# Patient Record
Sex: Female | Born: 1950 | Race: Black or African American | Hispanic: No | Marital: Single
Health system: Southern US, Community
[De-identification: ages and names within clinical notes are randomized; demographics above are authoritative.]

---

## 2015-01-30 ENCOUNTER — Ambulatory Visit: Payer: Self-pay | Admitting: Registered Nurse

## 2015-02-13 ENCOUNTER — Ambulatory Visit: Payer: Self-pay | Admitting: Registered Nurse

## 2015-02-26 ENCOUNTER — Ambulatory Visit: Payer: Self-pay | Admitting: Registered Nurse

## 2015-03-12 ENCOUNTER — Ambulatory Visit: Payer: Self-pay | Admitting: Surgery

## 2015-03-12 LAB — COMPREHENSIVE METABOLIC PANEL
ALT: 16 U/L
Albumin: 4.2 g/dL
Alkaline Phosphatase: 86 U/L
Anion Gap: 7 (ref 7–16)
BUN: 22 mg/dL — AB
Bilirubin,Total: 0.3 mg/dL
CALCIUM: 9.3 mg/dL
CO2: 31 mmol/L
Chloride: 100 mmol/L — ABNORMAL LOW
Creatinine: 0.51 mg/dL
EGFR (African American): >60
Glucose: 93 mg/dL
Potassium: 3.9 mmol/L
SGOT(AST): 17 U/L
Sodium: 138 mmol/L
Total Protein: 7.2 g/dL

## 2015-03-12 LAB — CBC WITH DIFFERENTIAL/PLATELET
Basophil #: 0.1 10*3/uL (ref 0.0–0.1)
Basophil %: 1.5 %
Eosinophil #: 0.2 10*3/uL (ref 0.0–0.7)
Eosinophil %: 4 %
HCT: 37.6 % (ref 35.0–47.0)
HGB: 12.5 g/dL (ref 12.0–16.0)
Lymphocyte #: 1.6 10*3/uL (ref 1.0–3.6)
Lymphocyte %: 35.8 %
MCH: 31.6 pg (ref 26.0–34.0)
MCHC: 33.3 g/dL (ref 32.0–36.0)
MCV: 95 fL (ref 80–100)
MONOS PCT: 8.4 %
Monocyte #: 0.4 x10 3/mm (ref 0.2–0.9)
NEUTROS ABS: 2.2 10*3/uL (ref 1.4–6.5)
Neutrophil %: 50.3 %
PLATELETS: 255 10*3/uL (ref 150–440)
RBC: 3.96 10*6/uL (ref 3.80–5.20)
RDW: 13.3 % (ref 11.5–14.5)
WBC: 4.5 10*3/uL (ref 3.6–11.0)

## 2015-03-24 ENCOUNTER — Ambulatory Visit
Admit: 2015-03-24 | Disposition: A | Discharge status: Home / Self Care | Payer: Self-pay | Attending: Surgery | Admitting: Surgery

## 2015-04-01 ENCOUNTER — Ambulatory Visit
Admit: 2015-04-01 | Disposition: A | Discharge status: Home / Self Care | Payer: Self-pay | Attending: Surgery | Admitting: Surgery

## 2015-04-01 HISTORY — PX: BREAST EXCISIONAL BIOPSY: SUR124

## 2015-04-13 LAB — SURGICAL PATHOLOGY

## 2015-04-19 NOTE — Op Note (Signed)
PATIENT NAME:  Karen Lynch, Karen Lynch MR#:  [id number] DATE OF BIRTH:  03/18/1951  DATE OF PROCEDURE:  04/01/2015  PREOPERATIVE DIAGNOSIS: Right mammographic abnormality with sclerosing adenosis.   POSTOPERATIVE DIAGNOSIS: Right mammographic abnormality with sclerosing adenosis.   PROCEDURE:  Right needle localization breast biopsy.   SURGEON: Tadeo Besecker E. Excell Seltzerooper, MD  ASSISTANT: (Dictation Anomaly)<<Elena>>  Nanine MeansKlaus, PA-S.   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 This necessitates excisional biopsy. The rationale for this has been discussed. The options of continued observation have been discussed. The risks of bleeding, infection, hematoma, seroma, recurrence, and missed lesion, as well as cosmetic deformity, were all discussed with her in the preop holding area as well. She understood and agreed to proceed.   FINDINGS: Call back from the radiologist, Dr. Manson PasseyBrown, demonstrated that the calcifications were within the specimen.   DESCRIPTION OF PROCEDURE: The patient was induced under general anesthesia. She was prepped and draped in a sterile fashion. VTE prophylaxis was in place. Preoperatively, the films had been reviewed.   Local anesthetic was infiltrated in the skin and subcutaneous tissues around the previously placed needle localization wire. An incision was made, lenticular in shape, to excise the current needle localization site as well as the core biopsy site which was about 5 mm away from the  current needle localization wire. Once this was performed, the specimen was labeled long lateral short superior with 2-0 nylon and then utilizing electrocautery, the specimen was developed out of the wound, hemostasis was with electrocautery. The specimen was examined. The tip of the wire was intact and not violated.  It was sent off for examination.   Dr. Manson PasseyBrown called back with the above findings.   Hemostasis  was with electrocautery and found to be adequate. The cavity walls were infiltrated with additional Marcaine with epinephrine for a total of 30 mL, and hemostasis again checked and found to be adequate. The wound was then closed in 2 layers of 3-0 Vicryl followed by 4-0 subcuticular Monocryl. Steri-Strips, Mastisol, and sterile dressings were placed.   The patient tolerated the procedure well. There were no complications. She was taken to the recovery room in stable condition to be discharged in the care of her family, followup in 10 days.    ____________________________ Adah Salvageichard E. Excell Seltzerooper, MD rec:[name]: 04/01/2015 14:12:19 ET T: 04/01/2015 16:42:15 ET JOB#: [id number]  cc: Adah Salvageichard E. Excell Seltzerooper, MD, <Dictator> [name] MD ELECTRONICALLY SIGNED 04/06/2015 20:39

## 2015-04-19 NOTE — H&P (Signed)
PATIENT NAME:  Karen Lynch, Karen Lynch MR#:  [id number] DATE OF BIRTH:  1951-10-09  DATE OF ADMISSION:  04/01/2015  CHIEF COMPLAINT: Right mammographic abnormality with sclerosing adenosis on core biopsy.   HISTORY OF PRESENT ILLNESS: This is a patient, who has had a right core biopsy in the past, and it showed calcifications with sclerosing adenosis. This is the indication for excisional biopsy, utilizing a needle localization technique. She notices no palpable masses and does do regular self exams.   GYNECOLOGIC HISTORY: G2, P2, AB0 and she has no family history of breast cancer. Her only medical problem is hypertension.   PAST MEDICAL HISTORY: Hypertension and recent right breast mass on mammogram only.   PAST SURGICAL HISTORY: Hysterectomy and knee replacement.   MEDICATIONS: Hyzaar.   ALLERGIES: NONE.   FAMILY HISTORY: No history of breast cancer.   SOCIAL HISTORY: The patient does not smoke or drink.   REVIEW OF SYSTEMS: A complete system review is documented in the office chart.   PHYSICAL EXAMINATION:  GENERAL: Healthy female patient.  HEENT: No scleral icterus.  NECK: No palpable neck nodes.  CHEST: Clear to auscultation.  CARDIAC: Regular rate and rhythm.  ABDOMEN: Soft, nontender.  EXTREMITIES: Without edema.  NEUROLOGIC: Grossly intact.  INTEGUMENT: No jaundice.  BREAST EXAMINATION: Demonstrates no left breast masses. On the right lower outer quadrant, there is a biopsy site without erythema, which is well-healed. There may be a small mass palpable at approximately 8:00 between the biopsy site and the nipple. No axillary adenopathy.   LABORATORY VALUES: Currently pending. They will be reviewed prior to surgery.   ASSESSMENT AND PLAN: Mammographic abnormality showing sclerosing adenosis. She requires excisional biopsy. The rationale for this has been discussed. The options of observation have been reviewed. The risks of bleeding, infection, recurrence, and cosmetic deformity,  as well as missed lesion have all been discussed with her. She understood and agreed to proceed.   ____________________________ Adah Salvageichard E. Excell Seltzerooper, MD rec:AT Lynch: 04/01/2015 09:03:34 ET T: 04/01/2015 09:35:20 ET JOB#: [id number]  cc: Adah Salvageichard E. Excell Seltzerooper, MD, <Dictator> Lattie HawICHARD E COOPER MD ELECTRONICALLY SIGNED 04/01/2015 11:50

## 2015-09-14 ENCOUNTER — Other Ambulatory Visit: Payer: Self-pay | Admitting: Family Medicine

## 2015-09-14 DIAGNOSIS — N63 Unspecified lump in unspecified breast: Secondary | ICD-10-CM

## 2015-09-14 DIAGNOSIS — M79621 Pain in right upper arm: Secondary | ICD-10-CM

## 2015-09-18 ENCOUNTER — Ambulatory Visit
Admission: RE | Admit: 2015-09-18 | Discharge: 2015-09-18 | Disposition: A | Discharge status: Home / Self Care | Payer: PRIVATE HEALTH INSURANCE | Source: Ambulatory Visit | Attending: Family Medicine | Admitting: Family Medicine

## 2015-09-18 DIAGNOSIS — Z9889 Other specified postprocedural states: Secondary | ICD-10-CM | POA: Insufficient documentation

## 2015-09-18 DIAGNOSIS — N63 Unspecified lump in unspecified breast: Secondary | ICD-10-CM

## 2015-09-18 DIAGNOSIS — M79621 Pain in right upper arm: Secondary | ICD-10-CM | POA: Insufficient documentation

## 2016-07-18 ENCOUNTER — Other Ambulatory Visit: Payer: Self-pay | Admitting: Family Medicine

## 2016-07-18 DIAGNOSIS — Z1231 Encounter for screening mammogram for malignant neoplasm of breast: Secondary | ICD-10-CM

## 2016-08-08 ENCOUNTER — Ambulatory Visit
Admission: RE | Admit: 2016-08-08 | Discharge: 2016-08-08 | Disposition: A | Discharge status: Home / Self Care | Payer: BLUE CROSS/BLUE SHIELD | Source: Ambulatory Visit | Attending: Family Medicine | Admitting: Family Medicine

## 2016-08-08 ENCOUNTER — Other Ambulatory Visit: Payer: Self-pay | Admitting: Family Medicine

## 2016-08-08 DIAGNOSIS — Z1231 Encounter for screening mammogram for malignant neoplasm of breast: Secondary | ICD-10-CM | POA: Diagnosis present

## 2016-08-10 ENCOUNTER — Ambulatory Visit: Payer: PRIVATE HEALTH INSURANCE

## 2016-10-03 IMAGING — MG MAM BREAST NEEDLE LOCAL RIGHT
6 series · 6 of 6 positions shown · non-contrast
Comparison: Previous exams.

CLINICAL DATA: Patient presents for needle localization prior to
excision for sclerosing papilloma.

EXAM:
NEEDLE LOCALIZATION OF THE RIGHT BREAST WITH MAMMO GUIDANCE

[R LM (1 of 3)]
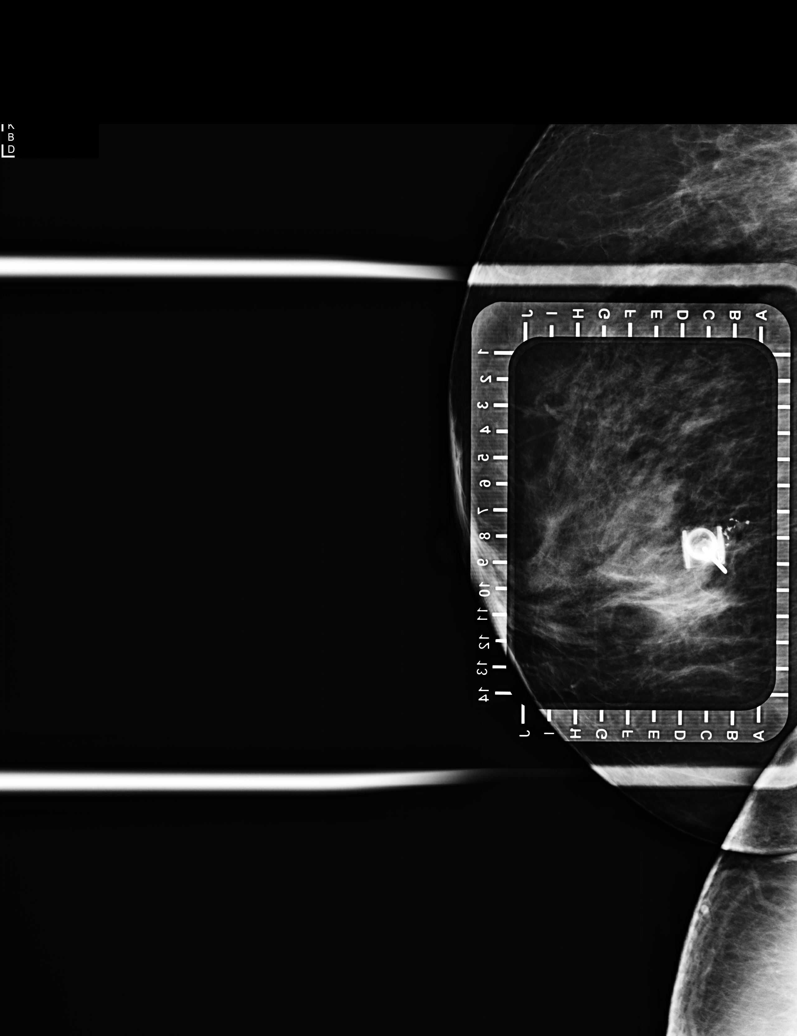

[R LM (2 of 3)]
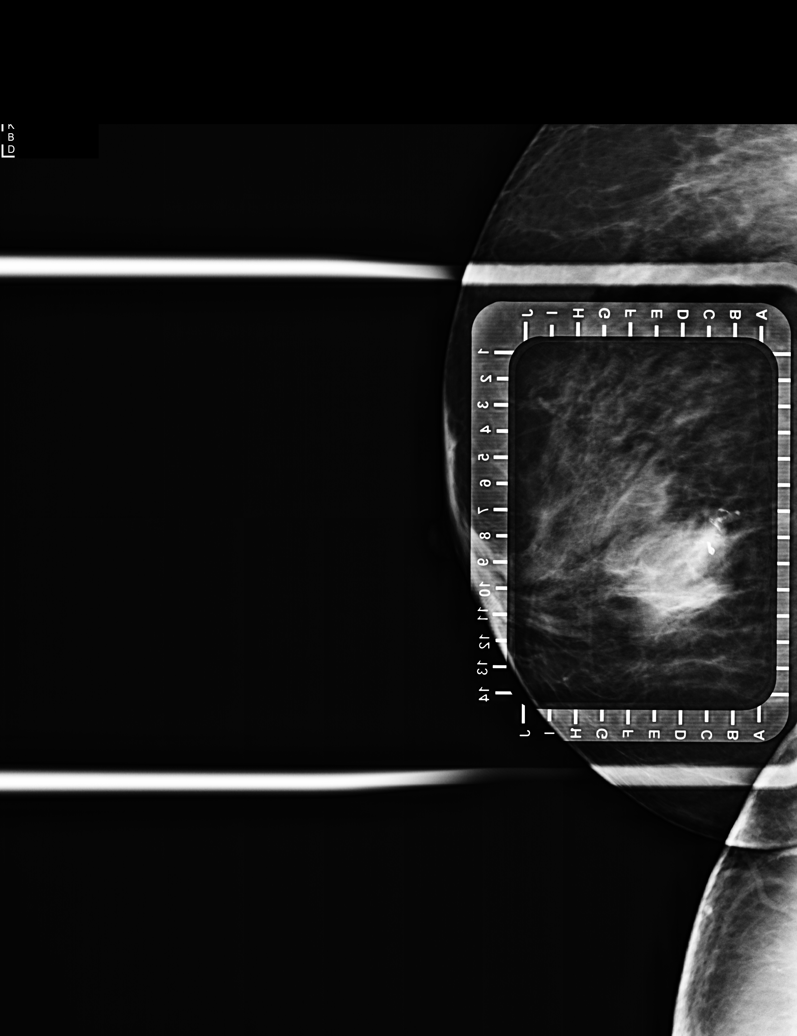

[R CC (1 of 3)]
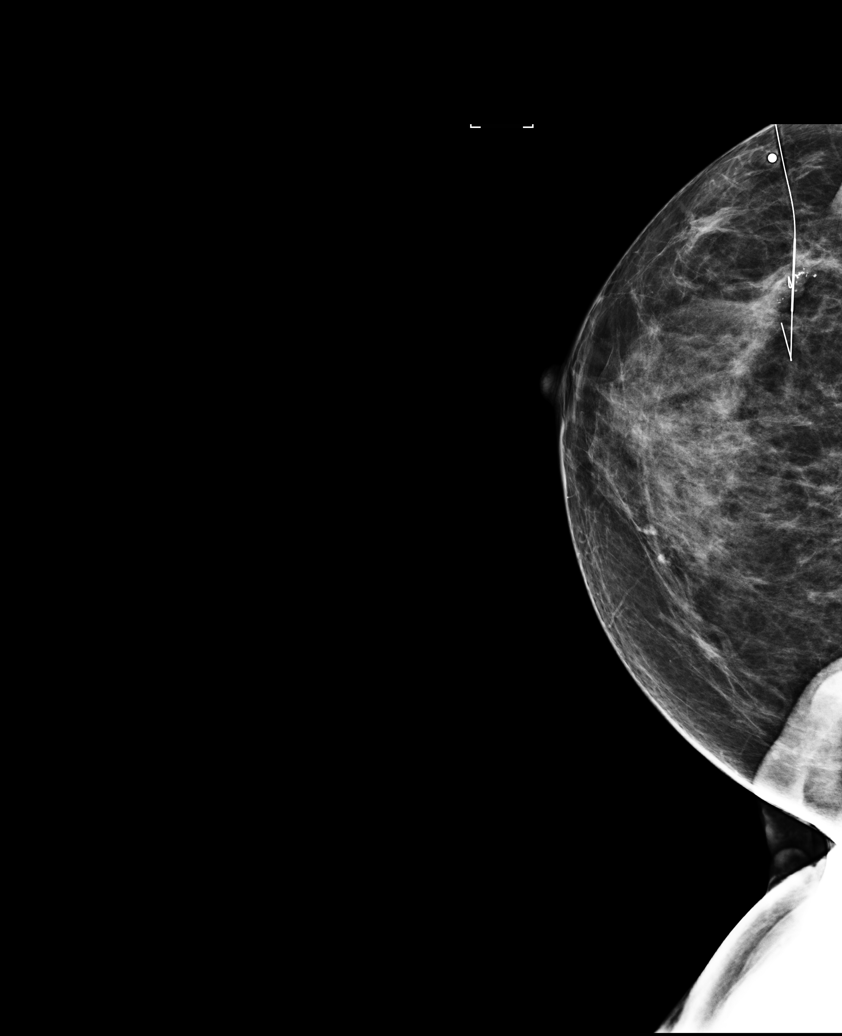

[R CC (2 of 3)]
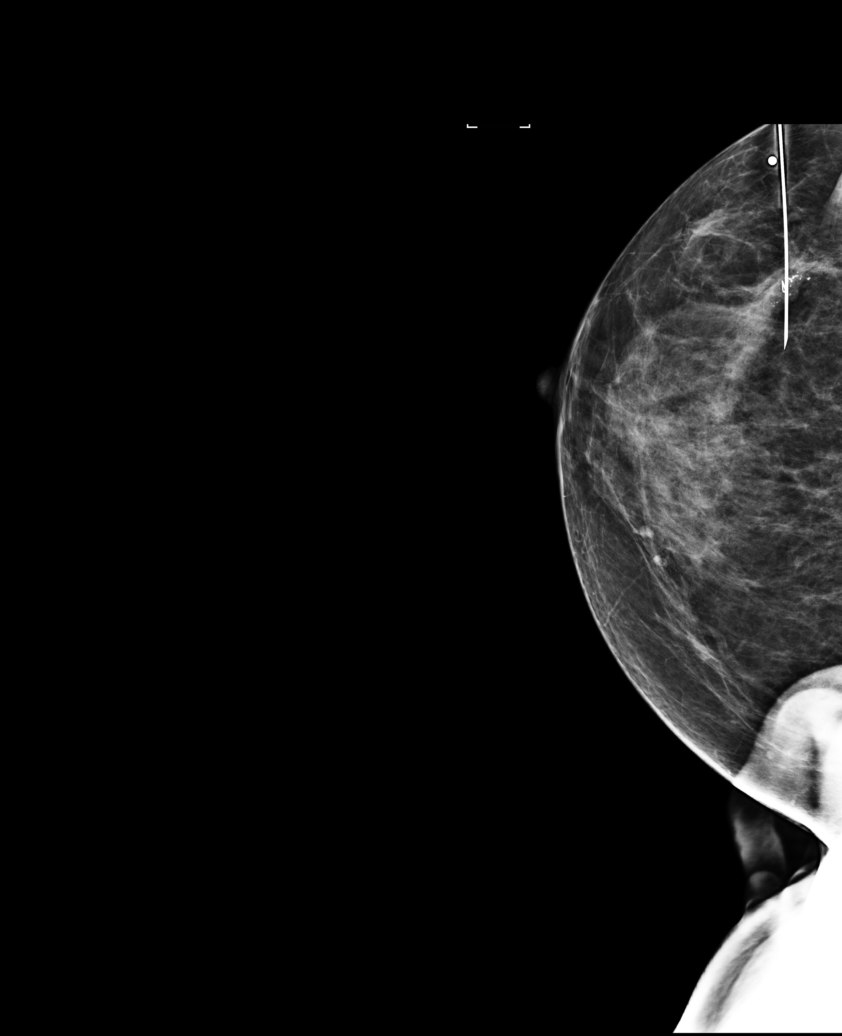

[R LM (3 of 3)]
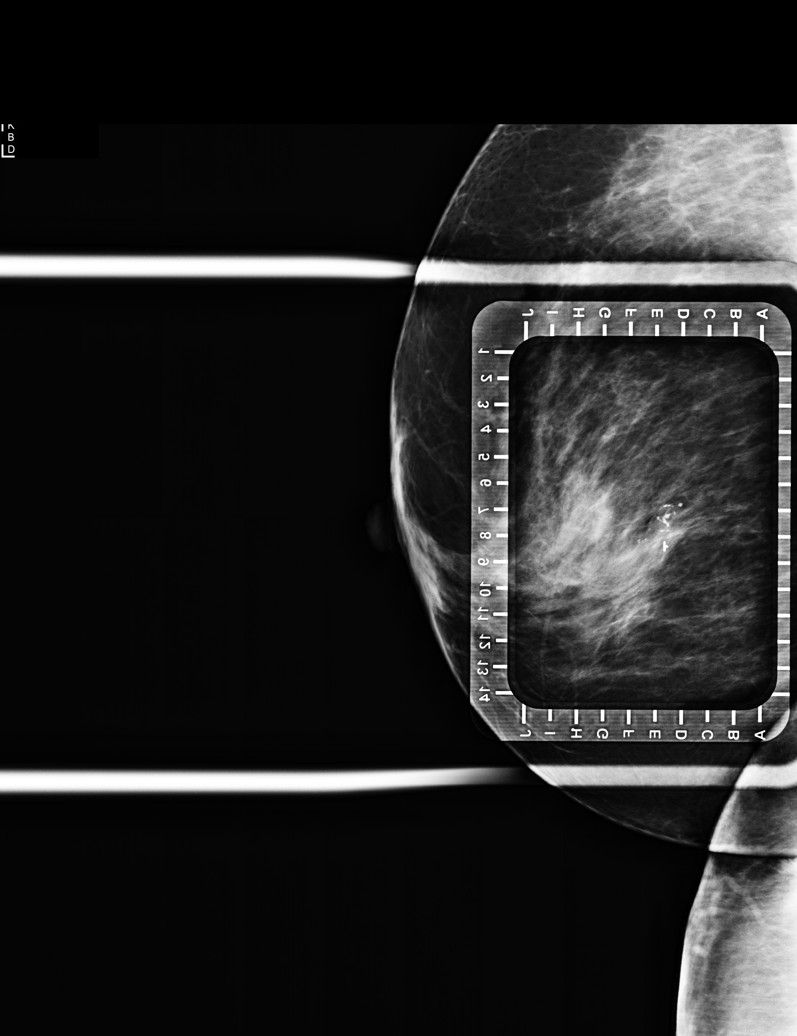

[R CC (3 of 3)]
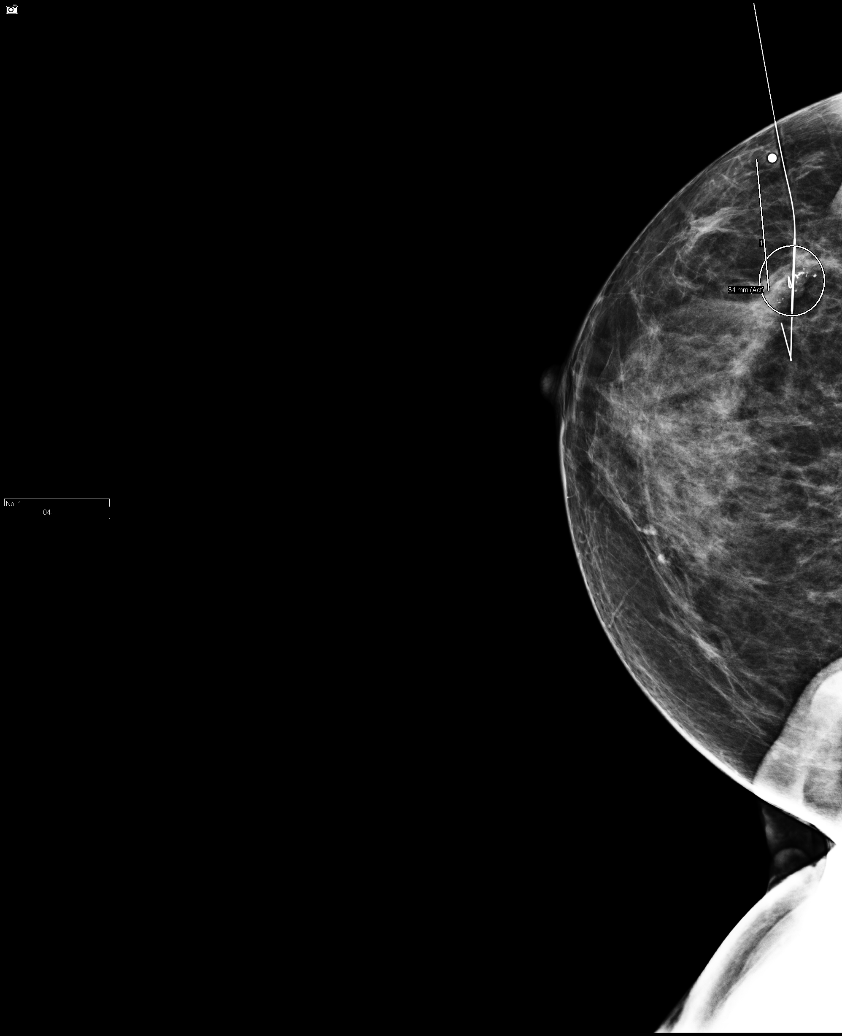

[6 of 6 positions shown; findings below may reference images not displayed]

FINDINGS: Patient presents for needle localization prior to excision. I met
with the patient and we discussed the procedure of needle
localization including benefits and alternatives. We discussed the
high likelihood of a successful procedure. We discussed the risks of
the procedure, including infection, bleeding, tissue injury, and
further surgery. Informed, written consent was given. The usual
time-out protocol was performed immediately prior to the procedure.

Using mammographic guidance, sterile technique, 2% lidocaine and a 7
cm modified Kopans needle, mass and clip in the lateral portion of
the right breast is localized using lateral approach. The images
were marked for Dr. Oul.
IMPRESSION: Needle localization right breast. No apparent complications.

## 2016-10-03 IMAGING — MG MAM BREAST SPECIMEN
1 series · 1 of 1 positions shown · non-contrast
Comparison: Previous exam(s).

CLINICAL DATA: Status post needle localization of right breast
mass.

EXAM:
SPECIMEN RADIOGRAPH OF THE RIGHT BREAST

[R CC]
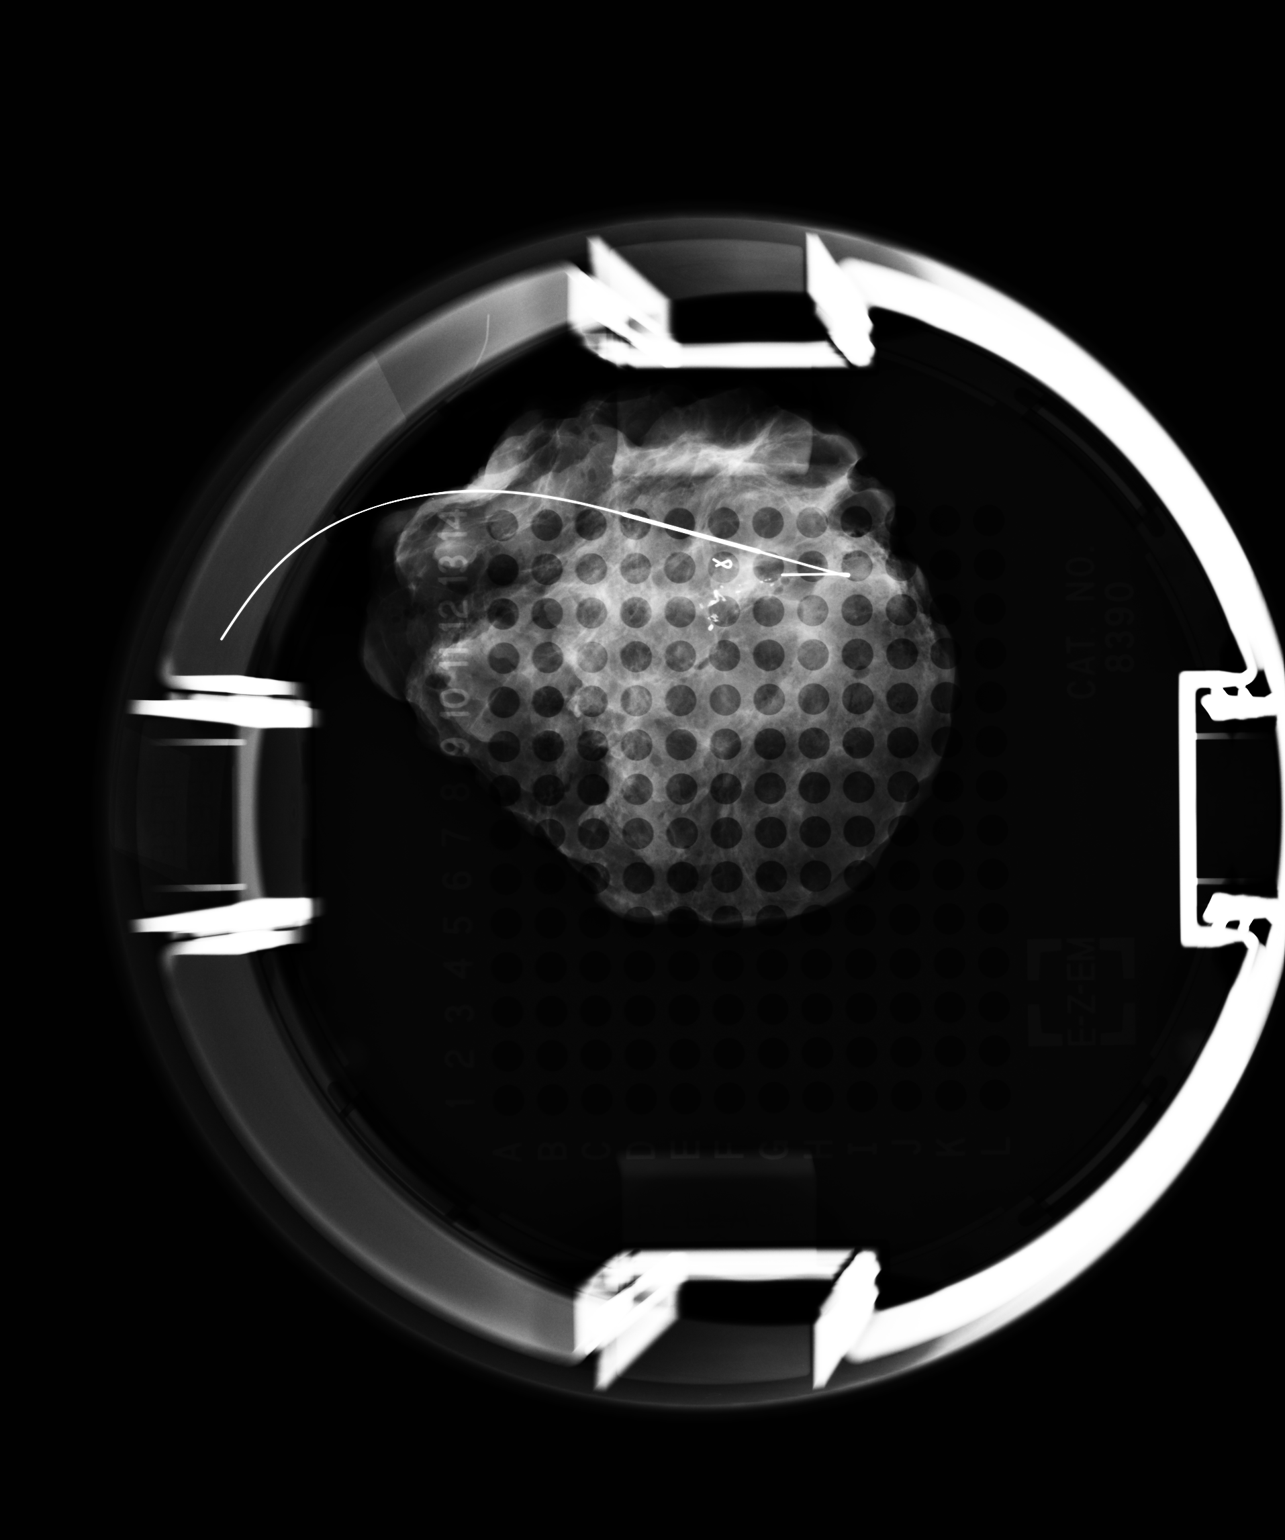

[1 of 1 positions shown; findings below may reference images not displayed]

FINDINGS: Status post excision of the right breast. The wire tip and biopsy
marker clip are present and are marked for pathology. I discussed
the findings with Dr. Ensan at the time of interpretation. The
specimen container is marked with the location of the tissue marker
clip.
IMPRESSION: Specimen radiograph of the right breast.
